# Patient Record
Sex: Female | Born: 1960 | Race: White | Hispanic: No | Marital: Single | Smoking: Former smoker
Health system: Southern US, Community
[De-identification: ages and names within clinical notes are randomized; demographics above are authoritative.]

## PROBLEM LIST (undated history)

## (undated) DIAGNOSIS — M1990 Unspecified osteoarthritis, unspecified site: Secondary | ICD-10-CM

## (undated) DIAGNOSIS — F32A Depression, unspecified: Secondary | ICD-10-CM

## (undated) DIAGNOSIS — F329 Major depressive disorder, single episode, unspecified: Secondary | ICD-10-CM

---

## 2014-12-22 ENCOUNTER — Ambulatory Visit (INDEPENDENT_AMBULATORY_CARE_PROVIDER_SITE_OTHER)
Admission: EM | Admit: 2014-12-22 | Discharge: 2014-12-22 | Disposition: A | Discharge status: Home / Self Care | Payer: No Typology Code available for payment source | Source: Home / Self Care | Attending: Family Medicine | Admitting: Family Medicine

## 2014-12-22 ENCOUNTER — Emergency Department
Admission: EM | Admit: 2014-12-22 | Discharge: 2014-12-22 | Disposition: A | Discharge status: Home / Self Care | Payer: No Typology Code available for payment source | Attending: Emergency Medicine | Admitting: Emergency Medicine

## 2014-12-22 ENCOUNTER — Encounter: Payer: Self-pay | Admitting: Emergency Medicine

## 2014-12-22 ENCOUNTER — Emergency Department: Payer: No Typology Code available for payment source

## 2014-12-22 DIAGNOSIS — W540XXA Bitten by dog, initial encounter: Secondary | ICD-10-CM | POA: Diagnosis not present

## 2014-12-22 DIAGNOSIS — Y9289 Other specified places as the place of occurrence of the external cause: Secondary | ICD-10-CM | POA: Diagnosis not present

## 2014-12-22 DIAGNOSIS — Y9389 Activity, other specified: Secondary | ICD-10-CM | POA: Diagnosis not present

## 2014-12-22 DIAGNOSIS — S91051A Open bite, right ankle, initial encounter: Secondary | ICD-10-CM | POA: Diagnosis not present

## 2014-12-22 DIAGNOSIS — S81851A Open bite, right lower leg, initial encounter: Secondary | ICD-10-CM | POA: Insufficient documentation

## 2014-12-22 DIAGNOSIS — Z87891 Personal history of nicotine dependence: Secondary | ICD-10-CM | POA: Diagnosis not present

## 2014-12-22 DIAGNOSIS — Z23 Encounter for immunization: Secondary | ICD-10-CM | POA: Diagnosis not present

## 2014-12-22 DIAGNOSIS — Y998 Other external cause status: Secondary | ICD-10-CM | POA: Insufficient documentation

## 2014-12-22 DIAGNOSIS — Z79899 Other long term (current) drug therapy: Secondary | ICD-10-CM | POA: Diagnosis not present

## 2014-12-22 HISTORY — DX: Unspecified osteoarthritis, unspecified site: M19.90

## 2014-12-22 HISTORY — DX: Major depressive disorder, single episode, unspecified: F32.9

## 2014-12-22 HISTORY — DX: Depression, unspecified: F32.A

## 2014-12-22 MED ORDER — BACITRACIN ZINC 500 UNIT/GM EX OINT
TOPICAL_OINTMENT | CUTANEOUS | Status: AC
  Administered 2014-12-22: 1 via TOPICAL
  Filled 2014-12-22: qty 0.9

## 2014-12-22 MED ORDER — BACITRACIN ZINC 500 UNIT/GM EX OINT
1.0000 "application " | TOPICAL_OINTMENT | Freq: Once | CUTANEOUS | Status: AC
  Administered 2014-12-22: 1 via TOPICAL

## 2014-12-22 MED ORDER — RABIES IMMUNE GLOBULIN 150 UNIT/ML IM INJ
20.0000 [IU]/kg | INJECTION | Freq: Once | INTRAMUSCULAR | Status: AC
  Administered 2014-12-22: 900 [IU] via INTRAMUSCULAR
  Filled 2014-12-22: qty 6

## 2014-12-22 MED ORDER — TETANUS-DIPHTH-ACELL PERTUSSIS 5-2.5-18.5 LF-MCG/0.5 IM SUSP
0.5000 mL | Freq: Once | INTRAMUSCULAR | Status: AC
  Administered 2014-12-22: 0.5 mL via INTRAMUSCULAR
  Filled 2014-12-22: qty 0.5

## 2014-12-22 MED ORDER — AMOXICILLIN-POT CLAVULANATE 875-125 MG PO TABS: 1.0000 | ORAL_TABLET | Freq: Two times a day (BID) | ORAL | Status: AC

## 2014-12-22 MED ORDER — RABIES VACCINE, PCEC IM SUSR
1.0000 mL | Freq: Once | INTRAMUSCULAR | Status: AC
  Administered 2014-12-22: 1 mL via INTRAMUSCULAR
  Filled 2014-12-22: qty 1
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
## 2014-12-22 NOTE — ED Notes (Signed)
Portable x-ray at the bedside.  
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
## 2014-12-22 NOTE — Discharge Instructions (Signed)
Take medication as prescribed. Apply ice and elevate right leg. Keep clean. Clean daily with soap and water, rinse, then apply thin layer of topical antibiotic such as Neosporin.  Return to the ER on days 3, 7 and 14 for wound check as well as remaining rabies immunizations.  Return to the ER sooner for increased pain, swelling, redness, drainage, new or worsening concerns.   Animal Bite An animal bite can result in a scratch on the skin, deep open cut, puncture of the skin, crush injury, or tearing away of the skin or a body part. Dogs are responsible for most animal bites. Children are bitten more often than adults. An animal bite can range from very mild to more serious. A small bite from your house pet is no cause for alarm. However, some animal bites can become infected or injure a bone or other tissue. You must seek medical care if:  The skin is broken and bleeding does not slow down or stop after 15 minutes.  The puncture is deep and difficult to clean (such as a cat bite).  Pain, warmth, redness, or pus develops around the wound.  The bite is from a stray animal or rodent. There may be a risk of rabies infection.  The bite is from a snake, raccoon, skunk, fox, coyote, or bat. There may be a risk of rabies infection.  The person bitten has a chronic illness such as diabetes, liver disease, or cancer, or the person takes medicine that lowers the immune system.  There is concern about the location and severity of the bite. It is important to clean and protect an animal bite wound right away to prevent infection. Follow these steps:  Clean the wound with plenty of water and soap.  Apply an antibiotic cream.  Apply gentle pressure over the wound with a clean towel or gauze to slow or stop bleeding.  Elevate the affected area above the heart to help stop any bleeding.  Seek medical care. Getting medical care within 8 hours of the animal bite leads to the best possible  outcome. DIAGNOSIS  Your caregiver will most likely:  Take a detailed history of the animal and the bite injury.  Perform a wound exam.  Take your medical history. Blood tests or X-rays may be performed. Sometimes, infected bite wounds are cultured and sent to a lab to identify the infectious bacteria.  TREATMENT  Medical treatment will depend on the location and type of animal bite as well as the patient's medical history. Treatment may include:  Wound care, such as cleaning and flushing the wound with saline solution, bandaging, and elevating the affected area.  Antibiotics.  Tetanus immunization.  Rabies immunization.  Leaving the wound open to heal. This is often done with animal bites, due to the high risk of infection. However, in certain cases, wound closure with stitches, wound adhesive, skin adhesive strips, or staples may be used. Infected bites that are left untreated may require intravenous (IV) antibiotics and surgical treatment in the hospital. HOME CARE INSTRUCTIONS  Follow your caregiver's instructions for wound care.  Take all medicines as directed.  If your caregiver prescribes antibiotics, take them as directed. Finish them even if you start to feel better.  Follow up with your caregiver for further exams or immunizations as directed. You may need a tetanus shot if:  You cannot remember when you had your last tetanus shot.  You have never had a tetanus shot.  The injury broke your skin. If  you get a tetanus shot, your arm may swell, get red, and feel warm to the touch. This is common and not a problem. If you need a tetanus shot and you choose not to have one, there is a rare chance of getting tetanus. Sickness from tetanus can be serious. SEEK MEDICAL CARE IF:  You notice warmth, redness, soreness, swelling, pus discharge, or a bad smell coming from the wound.  You have a red line on the skin coming from the wound.  You have a fever, chills, or a  general ill feeling.  You have nausea or vomiting.  You have continued or worsening pain.  You have trouble moving the injured part.  You have other questions or concerns. MAKE SURE YOU:  Understand these instructions.  Will watch your condition.  Will get help right away if you are not doing well or get worse. Document Released: 02/13/2011 Document Revised: 08/20/2011 Document Reviewed: 02/13/2011 [facility] Patient Information 2015 [place], [place]. This information is not intended to replace advice given to you by your health care provider. Make sure you discuss any questions you have with your health care provider.  Rabies Vaccine What You Need to Know WHAT IS RABIES?  Rabies is a serious disease. It is caused by a virus.  Rabies is mainly a disease of animals. Humans get rabies when they are bitten by infected animals.  At first there might not be any symptoms. But weeks, or even years after a bite, rabies can cause pain, fatigue, headaches, fever, and irritability. These are followed by seizures, hallucinations, and paralysis. Human rabies is almost always fatal.  Wild animals, especially bats, are the most common source of human rabies infection in the [place]. Skunks, raccoons, dogs, cats, coyotes, foxes, and other mammals can also transmit the disease.  Human rabies is rare in the [place]. There have been only 55 cases diagnosed since 1990. However, between 16,000 and 39,000 people are vaccinated each year as a precaution after animal bites. Also, rabies is far more common in other parts of the world, with about 40,000 to 70,000 rabies-related deaths worldwide each year. Bites from unvaccinated dogs cause most of these cases. Rabies vaccine can prevent rabies. RABIES VACCINE  Rabies vaccine is given to people at high risk of rabies to protect them if they are exposed. It can also prevent the disease if it is given to a person after they have been  exposed.  Rabies vaccine is made from killed rabies virus. It cannot cause rabies. WHO SHOULD GET RABIES VACCINE AND WHEN? Preventive Vaccination (No Exposure)  People at high risk of exposure to rabies, such as veterinarians, Educational psychologist, rabies laboratory workers, spelunkers, and rabies biologics production workers should be offered rabies vaccine.  The vaccine should also be considered for:  People whose activities bring them into frequent contact with rabies virus or with possibly rabid animals.  International travelers who are likely to come in contact with animals in parts of the world where rabies is common.  The pre-exposure schedule for rabies vaccination is 3 doses, given at the following times:  Dose 1: As appropriate.  Dose 2: 7 days after Dose 1.  Dose 3: 21 days or 28 days after Dose 1.  For laboratory workers and others who may be repeatedly exposed to rabies virus, periodic testing for immunity is recommended and booster doses should be given as needed. (Testing or booster doses are not recommended for travelers). Ask your doctor for details. Vaccination After  an Exposure Anyone who has been bitten by an animal, or who otherwise may have been exposed to rabies, should clean the wound and see a doctor immediately. The doctor will determine if they need to be vaccinated. A person who is exposed and has never been vaccinated against rabies should get 4 doses of rabies vaccine: one dose right away and additional doses on the 3rd, 7th, and 14th days. They should also get another shot called Rabies Immune Globulin at the same time as the first dose.  A person who has been previously vaccinated should get 2 doses of rabies vaccine: one right away and another on the 3rd day. Rabies Immune Globulin is not needed. TELL YOUR DOCTOR IF: Talk with a doctor before getting rabies vaccine if you:  Ever had a serious (life-threatening) allergic reaction to a previous dose of rabies  vaccine or to any component of the vaccine; tell your doctor if you have any severe allergies.  Have a weakened immune system because of:  HIV, AIDS, or another disease that affects the immune system.  Treatment with drugs that affect the immune system, such as steroids.  Cancer or cancer treatment with radiation or drugs. If you have a minor illness, such as a cold, you can be vaccinated. If you are moderately or severely ill, you should probably wait until you recover before getting a routine (non-exposure) dose of rabies vaccine. If you have been exposed to rabies virus, you should get the vaccine regardless of any other illnesses you may have. WHAT ARE THE RISKS FROM RABIES VACCINE? A vaccine, like any medicine, is capable of causing serious problems, such as severe allergic reactions. The risk of a vaccine causing serious harm, or death, is extremely small. Serious problems from rabies vaccine are very rare.  Mild problems:  Soreness, redness, swelling, or itching where the shot was given (30% to 74%).  Headache, nausea, abdominal pain, muscle aches, or dizziness (5% to 40%). Moderate problems:  Hives, pain in the joints, or fever (about 6% of booster doses).  Other nervous system disorders, such as Guillain-Barr Syndrome (GBS), have been reported after rabies vaccine, but this happens so rarely that it is not known whether they are related to the vaccine. Note: Several brands of rabies vaccine are available in the [place], and reactions may vary between brands. Your provider can give you more information about a particular brand. WHAT IF THERE IS A SERIOUS REACTION? What should I look for? Look for anything that concerns you, such as signs of a severe allergic reaction, very high fever, or behavior changes.  Signs of a severe allergic reaction can include hives, swelling of the face and throat, difficulty breathing, a fast heartbeat, dizziness, and weakness. These would start  a few minutes to a few hours after the vaccination. What should I do?  If you think it is a severe allergic reaction or other emergency that cannot wait, call 911 or get the person to the nearest hospital. Otherwise, call your doctor.  Afterward, the reaction should be reported to the Vaccine Adverse Event Reporting System (VAERS). Your doctor might file this report, or you can do it yourself through the VAERS website at www.vaers.LAgents.nohhs.gov or by calling 1-361-356-6291. VAERS is only for reporting reactions. They do not give medical advice. HOW CAN I LEARN MORE?  Ask your doctor.  Call your local or state health department.  Contact the Centers for Disease Control and Prevention (CDC):  Visit the CDC rabies website at  wwwcrv.com CDC Rabies Vaccine VIS (03/16/08) Document Released: 03/25/2006 Document Revised: 05/14/2012 Document Reviewed: 09/17/2012 [facility] Patient Information 2015 [place], [place]. This information is not intended to replace advice given to you by your health care provider. Make sure you discuss any questions you have with your health care provider.

## 2014-12-22 NOTE — ED Notes (Signed)
Dog bite to right lower leg ..animal control aware

## 2014-12-22 NOTE — ED Notes (Signed)
This nurse [facility] and verified that Rachell CiproShane Oakley did investigate this Animal Bite. The dog has been taken into Quarantine and they are not sure of Rabies/Vaccine status of the animal.

## 2017-02-13 IMAGING — CR DG TIBIA/FIBULA 2V*R*
1 series · 2 of 2 positions shown · non-contrast
Comparison: None.

CLINICAL DATA: Bit on right lower leg by dog.

EXAM:
RIGHT TIBIA AND FIBULA - 2 VIEW

[Series 1: ap · 0.17mm/px · 2 of 2 slices shown]
[im 1/2]
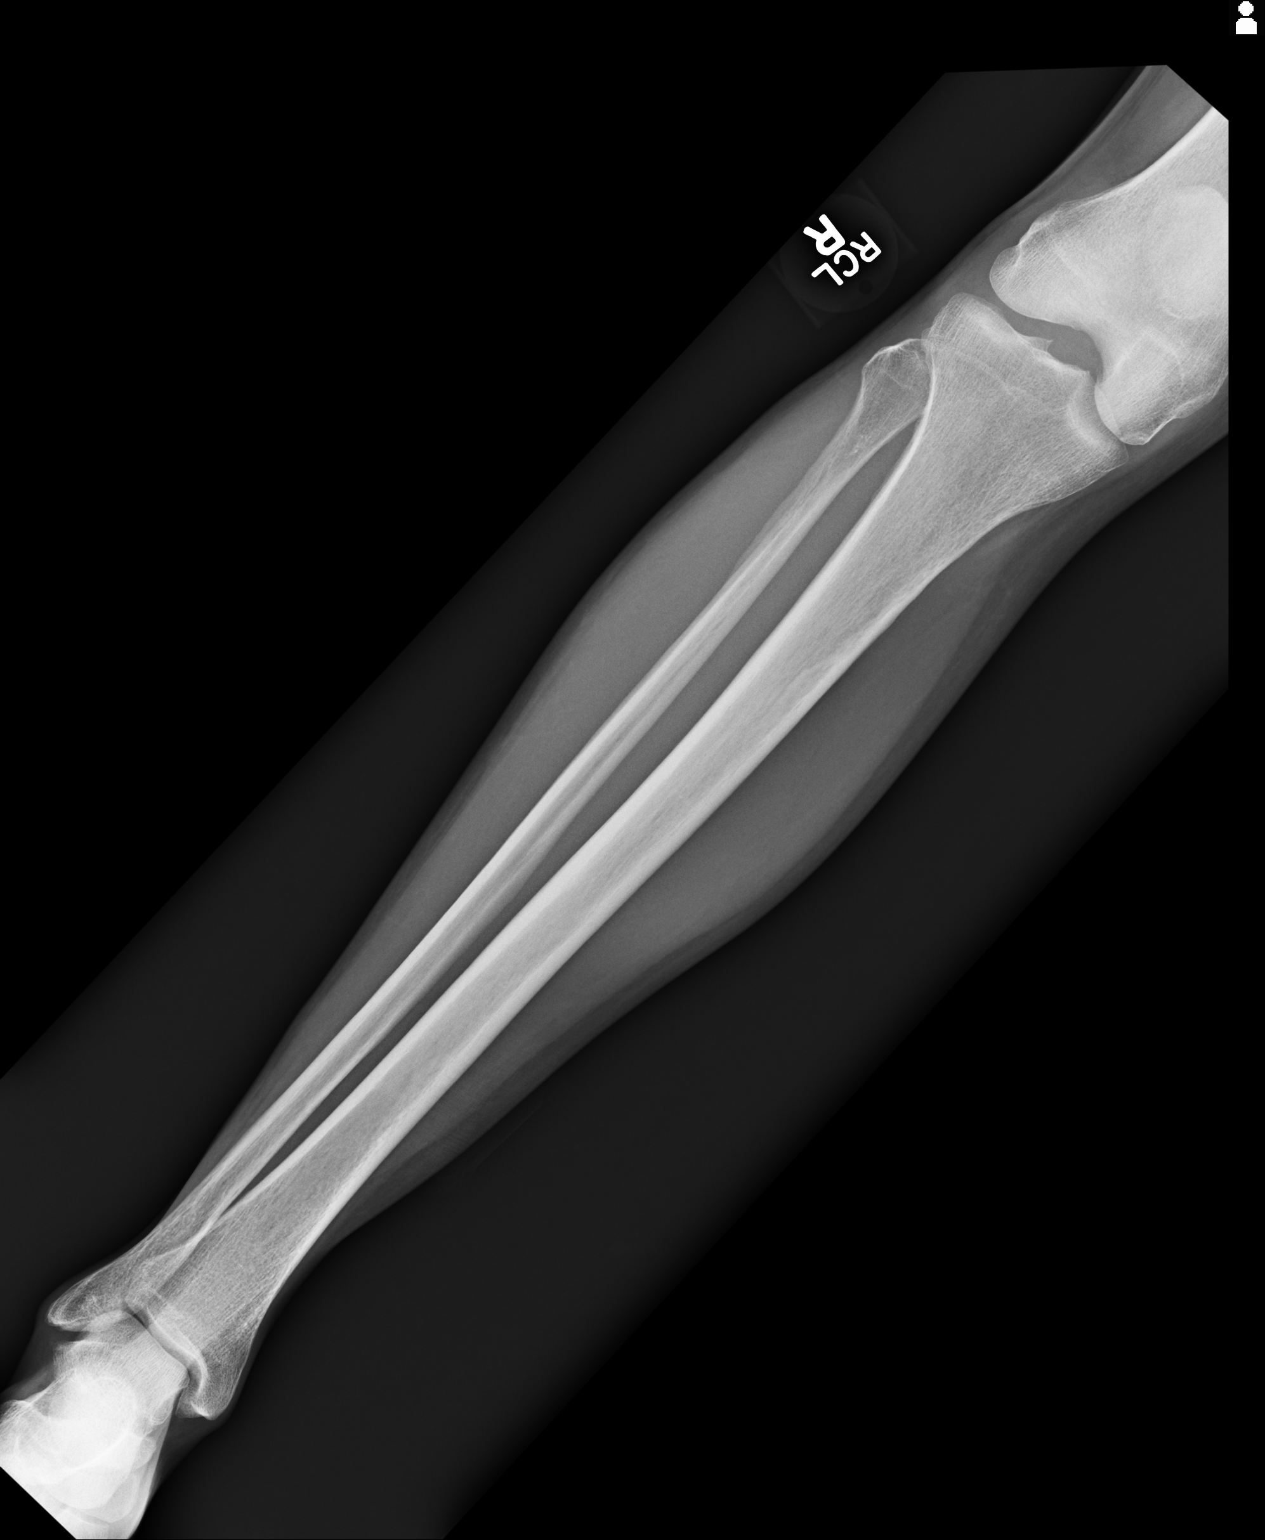
[im 2/2]
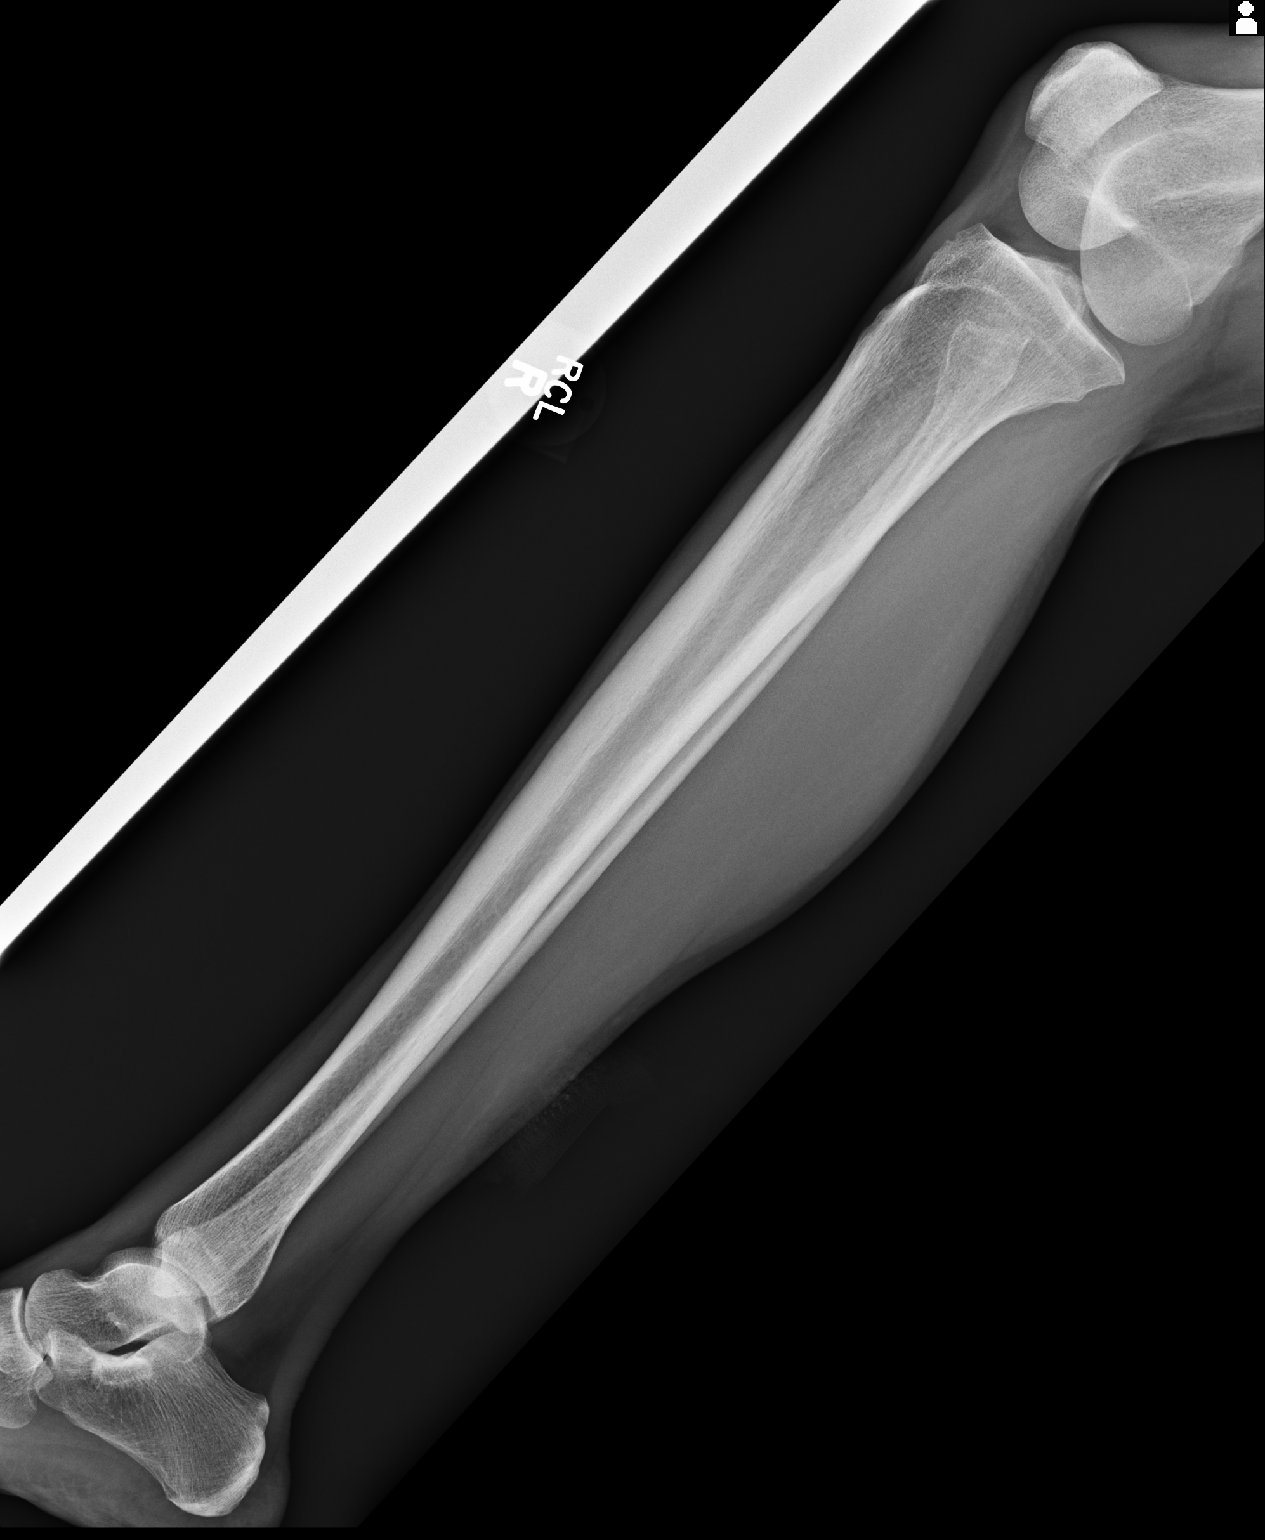

[2 of 2 positions shown; findings below may reference images not displayed]

FINDINGS: Bandaging posteriorly along the mid to distal portion of the calf.
No unexpected foreign body, bony abnormality, or significant amount
of gas tracking in the soft tissues.
IMPRESSION: No foreign body, bony abnormality, or significant amount of gas
tracking in the soft tissues.
